# Patient Record
Sex: Female | Born: 1980 | Race: White | Hispanic: No | Marital: Single | Smoking: Never smoker
Health system: Southern US, Community
[De-identification: ages and names within clinical notes are randomized; demographics above are authoritative.]

## PROBLEM LIST (undated history)

## (undated) DIAGNOSIS — B373 Candidiasis of vulva and vagina: Secondary | ICD-10-CM

## (undated) DIAGNOSIS — Z833 Family history of diabetes mellitus: Secondary | ICD-10-CM

## (undated) DIAGNOSIS — Z8249 Family history of ischemic heart disease and other diseases of the circulatory system: Secondary | ICD-10-CM

## (undated) DIAGNOSIS — N870 Mild cervical dysplasia: Secondary | ICD-10-CM

## (undated) DIAGNOSIS — E611 Iron deficiency: Secondary | ICD-10-CM

## (undated) DIAGNOSIS — Z8742 Personal history of other diseases of the female genital tract: Secondary | ICD-10-CM

## (undated) DIAGNOSIS — B3731 Acute candidiasis of vulva and vagina: Secondary | ICD-10-CM

## (undated) DIAGNOSIS — B379 Candidiasis, unspecified: Secondary | ICD-10-CM

## (undated) DIAGNOSIS — Z8619 Personal history of other infectious and parasitic diseases: Secondary | ICD-10-CM

## (undated) DIAGNOSIS — R87613 High grade squamous intraepithelial lesion on cytologic smear of cervix (HGSIL): Secondary | ICD-10-CM

## (undated) HISTORY — PX: WISDOM TOOTH EXTRACTION: SHX21

## (undated) HISTORY — DX: Iron deficiency: E61.1

## (undated) HISTORY — DX: Personal history of other diseases of the female genital tract: Z87.42

## (undated) HISTORY — DX: Personal history of other infectious and parasitic diseases: Z86.19

## (undated) HISTORY — DX: Acute candidiasis of vulva and vagina: B37.31

## (undated) HISTORY — DX: High grade squamous intraepithelial lesion on cytologic smear of cervix (HGSIL): R87.613

## (undated) HISTORY — DX: Family history of diabetes mellitus: Z83.3

## (undated) HISTORY — DX: Candidiasis of vulva and vagina: B37.3

## (undated) HISTORY — DX: Candidiasis, unspecified: B37.9

## (undated) HISTORY — DX: Family history of ischemic heart disease and other diseases of the circulatory system: Z82.49

## (undated) HISTORY — DX: Mild cervical dysplasia: N87.0

---
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
## 2005-08-29 DIAGNOSIS — Z8742 Personal history of other diseases of the female genital tract: Secondary | ICD-10-CM

## 2005-08-29 HISTORY — DX: Personal history of other diseases of the female genital tract: Z87.42

## 2005-09-27 ENCOUNTER — Other Ambulatory Visit: Admission: RE | Admit: 2005-09-27 | Discharge: 2005-09-27 | Payer: Self-pay | Admitting: Obstetrics and Gynecology

## 2005-12-04 DIAGNOSIS — N870 Mild cervical dysplasia: Secondary | ICD-10-CM

## 2005-12-04 DIAGNOSIS — R87613 High grade squamous intraepithelial lesion on cytologic smear of cervix (HGSIL): Secondary | ICD-10-CM

## 2005-12-04 HISTORY — DX: High grade squamous intraepithelial lesion on cytologic smear of cervix (HGSIL): R87.613

## 2005-12-04 HISTORY — DX: Mild cervical dysplasia: N87.0

## 2006-05-21 ENCOUNTER — Other Ambulatory Visit: Admission: RE | Admit: 2006-05-21 | Discharge: 2006-05-21 | Payer: Self-pay | Admitting: Obstetrics and Gynecology

## 2008-08-21 ENCOUNTER — Emergency Department (HOSPITAL_COMMUNITY): Admission: EM | Admit: 2008-08-21 | Discharge: 2008-08-21 | Payer: Self-pay | Admitting: Family Medicine

## 2011-07-31 LAB — POCT URINALYSIS DIP (DEVICE)
Glucose, UA: NEGATIVE
Ketones, ur: NEGATIVE
Operator id: 270961
Specific Gravity, Urine: 1.015
Urobilinogen, UA: 0.2

## 2011-07-31 LAB — URINE CULTURE

## 2011-07-31 LAB — POCT PREGNANCY, URINE: Preg Test, Ur: NEGATIVE

## 2012-03-03 ENCOUNTER — Other Ambulatory Visit (INDEPENDENT_AMBULATORY_CARE_PROVIDER_SITE_OTHER): Payer: Medicaid Other

## 2012-03-03 DIAGNOSIS — Z3009 Encounter for other general counseling and advice on contraception: Secondary | ICD-10-CM

## 2012-03-03 MED ORDER — MEDROXYPROGESTERONE ACETATE 150 MG/ML IM SUSP
150.0000 mg | Freq: Once | INTRAMUSCULAR | Status: AC
  Administered 2012-03-03: 150 mg via INTRAMUSCULAR

## 2012-03-03 NOTE — Progress Notes (Unsigned)
Next Depo due 05-25-2012 

## 2012-03-28 ENCOUNTER — Ambulatory Visit (INDEPENDENT_AMBULATORY_CARE_PROVIDER_SITE_OTHER): Payer: Medicaid Other | Admitting: Obstetrics and Gynecology

## 2012-03-28 ENCOUNTER — Encounter: Payer: Self-pay | Admitting: Obstetrics and Gynecology

## 2012-03-28 VITALS — BP 122/68 | Ht 65.0 in | Wt 143.0 lb

## 2012-03-28 DIAGNOSIS — R109 Unspecified abdominal pain: Secondary | ICD-10-CM

## 2012-03-28 DIAGNOSIS — N951 Menopausal and female climacteric states: Secondary | ICD-10-CM

## 2012-03-28 DIAGNOSIS — Z0000 Encounter for general adult medical examination without abnormal findings: Secondary | ICD-10-CM

## 2012-03-28 DIAGNOSIS — R232 Flushing: Secondary | ICD-10-CM

## 2012-03-28 DIAGNOSIS — Z124 Encounter for screening for malignant neoplasm of cervix: Secondary | ICD-10-CM

## 2012-03-28 LAB — TSH: TSH: 1.107 u[IU]/mL (ref 0.350–4.500)

## 2012-03-28 MED ORDER — MEDROXYPROGESTERONE ACETATE 150 MG/ML IM SUSP: 150.0000 mg | INTRAMUSCULAR | Status: DC

## 2012-03-28 NOTE — Progress Notes (Signed)
Last Pap: 01/19/2011 WNL: Yes Regular Periods:no Contraception: Depo Provera  Monthly Breast exam:yes Tetanus<79yrs:no Nl.Bladder Function:yes Daily BMs:yes Healthy Diet:yes Calcium:yes Mammogram:no Exercise:yes Seatbelt: yes Abuse at home: no Stressful work:no Sigmoid-colonoscopy: N/A  Bone Density: No Pt with left sided pain for one day.  Feels better to lay on it.  No trauma to her side.  No back pain or UTI sxs.   Pt also with hot flashes for six months.  No abnormal bleeding Physical Examination: General appearance - alert, well appearing, and in no distress Mental status - normal mood, behavior, speech, dress, motor activity, and thought processes Neck - supple, no significant adenopathy, thyroid exam: thyroid is normal in size without nodules or tenderness Chest - clear to auscultation, no wheezes, rales or rhonchi, symmetric air entry.  No rib tenderness or bruising on her left side Heart - normal rate and regular rhythm Abdomen - soft, nontender, nondistended, no masses or organomegaly Breasts - breasts appear normal, no suspicious masses, no skin or nipple changes or axillary nodes Pelvic - normal external genitalia, vulva, vagina, cervix, uterus and adnexa Rectal - normal rectal, no masses, rectal exam not indicated Back exam - full range of motion, no tenderness, palpable spasm or pain on motion Neurological - alert, oriented, normal speech, no focal findings or movement disorder noted Musculoskeletal - no joint tenderness, deformity or swelling Extremities - no edema, redness or tenderness in the calves or thighs Skin - normal coloration and turgor, no rashes, no suspicious skin lesions noted Routine exam Pap sent yes h/o abnormal pap Mammogram due no pt desires to stay on depo provera.  black box warnings reviewed wiht the pt.  check TSH for hot flashes Tylenol and wrm compresses for side pain RT 1 yr

## 2012-04-02 LAB — PAP IG, CT-NG, RFX HPV ASCU
Chlamydia Probe Amp: NEGATIVE
GC Probe Amp: NEGATIVE

## 2012-04-03 ENCOUNTER — Telehealth: Payer: Self-pay

## 2012-04-03 MED ORDER — METRONIDAZOLE 500 MG PO TABS: 500.0000 mg | ORAL_TABLET | Freq: Two times a day (BID) | ORAL | Status: DC

## 2012-04-03 NOTE — Telephone Encounter (Signed)
Spoke with pt rgd labs informed tsh wnl pap wnl but showed bv need abx pt wants rx sent to [facility] [address] and holden advised pt rx sent to pharm pt voice understanding

## 2012-04-03 NOTE — Telephone Encounter (Signed)
Lm on vm tcb rgd labs 

## 2012-04-03 NOTE — Telephone Encounter (Signed)
Message copied by Rolla Plate on Thu Apr 03, 2012 11:10 AM ------      Message from: Jaymes Graff      Created: Wed Apr 02, 2012 11:59 PM       Please tell pt BV was found on her pap smear.  She can be treated with either Metrogel one applicator in vagina QHS for five nights or Flagyl 500mg  one tablet twice a day for seven days.

## 2012-04-06 ENCOUNTER — Emergency Department (HOSPITAL_COMMUNITY): Payer: Medicaid Other

## 2012-04-06 ENCOUNTER — Encounter (HOSPITAL_COMMUNITY): Payer: Self-pay | Admitting: *Deleted

## 2012-04-06 ENCOUNTER — Emergency Department (HOSPITAL_COMMUNITY)
Admission: EM | Admit: 2012-04-06 | Discharge: 2012-04-07 | Disposition: A | Discharge status: Home / Self Care | Payer: Medicaid Other | Attending: Emergency Medicine | Admitting: Emergency Medicine

## 2012-04-06 DIAGNOSIS — R079 Chest pain, unspecified: Secondary | ICD-10-CM | POA: Insufficient documentation

## 2012-04-06 DIAGNOSIS — R10812 Left upper quadrant abdominal tenderness: Secondary | ICD-10-CM | POA: Insufficient documentation

## 2012-04-06 DIAGNOSIS — R059 Cough, unspecified: Secondary | ICD-10-CM | POA: Insufficient documentation

## 2012-04-06 DIAGNOSIS — R071 Chest pain on breathing: Secondary | ICD-10-CM | POA: Insufficient documentation

## 2012-04-06 DIAGNOSIS — R0789 Other chest pain: Secondary | ICD-10-CM

## 2012-04-06 DIAGNOSIS — R05 Cough: Secondary | ICD-10-CM | POA: Insufficient documentation

## 2012-04-06 LAB — CBC
HCT: 41.8 % (ref 36.0–46.0)
Hemoglobin: 14.1 g/dL (ref 12.0–15.0)
MCH: 30.9 pg (ref 26.0–34.0)
MCV: 91.5 fL (ref 78.0–100.0)
Platelets: 206 10*3/uL (ref 150–400)
RBC: 4.57 MIL/uL (ref 3.87–5.11)
WBC: 6.4 10*3/uL (ref 4.0–10.5)

## 2012-04-06 LAB — DIFFERENTIAL
Eosinophils Absolute: 0.1 10*3/uL (ref 0.0–0.7)
Eosinophils Relative: 2 % (ref 0–5)
Lymphocytes Relative: 27 % (ref 12–46)
Lymphs Abs: 1.7 10*3/uL (ref 0.7–4.0)
Monocytes Absolute: 0.7 10*3/uL (ref 0.1–1.0)
Monocytes Relative: 12 % (ref 3–12)

## 2012-04-06 LAB — POCT I-STAT, CHEM 8
BUN: 6 mg/dL (ref 6–23)
Calcium, Ion: 1.21 mmol/L (ref 1.12–1.32)
Creatinine, Ser: 0.8 mg/dL (ref 0.50–1.10)
Glucose, Bld: 90 mg/dL (ref 70–99)
Hemoglobin: 15 g/dL (ref 12.0–15.0)
Sodium: 142 mEq/L (ref 135–145)
TCO2: 24 mmol/L (ref 0–100)

## 2012-04-06 LAB — URINALYSIS, ROUTINE W REFLEX MICROSCOPIC
Bilirubin Urine: NEGATIVE
Hgb urine dipstick: NEGATIVE
Nitrite: NEGATIVE
Protein, ur: NEGATIVE mg/dL
Specific Gravity, Urine: 1.006 (ref 1.005–1.030)
Urobilinogen, UA: 0.2 mg/dL (ref 0.0–1.0)

## 2012-04-06 LAB — URINE MICROSCOPIC-ADD ON

## 2012-04-06 MED ORDER — NAPROXEN 500 MG PO TABS: 500.0000 mg | ORAL_TABLET | Freq: Two times a day (BID) | ORAL | Status: AC

## 2012-04-06 MED ORDER — OXYCODONE-ACETAMINOPHEN 5-325 MG PO TABS
1.0000 | ORAL_TABLET | Freq: Once | ORAL | Status: AC
  Administered 2012-04-06: 1 via ORAL
  Filled 2012-04-06: qty 1

## 2012-04-06 NOTE — Discharge Instructions (Signed)
Chest Wall Pain Chest wall pain is pain in or around the bones and muscles of your chest. It may take up to 6 weeks to get better. It may take longer if you must stay physically active in your work and activities.  CAUSES  Chest wall pain may happen on its own. However, it may be caused by:  A viral illness like the flu.   Injury.   Coughing.   Exercise.   Arthritis.   Fibromyalgia.   Shingles.  HOME CARE INSTRUCTIONS   Avoid overtiring physical activity. Try not to strain or perform activities that cause pain. This includes any activities using your chest or your abdominal and side muscles, especially if heavy weights are used.   Put ice on the sore area.   Put ice in a plastic bag.   Place a towel between your skin and the bag.   Leave the ice on for 15 to 20 minutes per hour while awake for the first 2 days.   Only take over-the-counter or prescription medicines for pain, discomfort, or fever as directed by your caregiver.  SEEK IMMEDIATE MEDICAL CARE IF:   Your pain increases, or you are very uncomfortable.   You have a fever.   Your chest pain becomes worse.   You have new, unexplained symptoms.   You have nausea or vomiting.   You feel sweaty or lightheaded.   You have a cough with phlegm (sputum), or you cough up blood.  MAKE SURE YOU:   Understand these instructions.   Will watch your condition.   Will get help right away if you are not doing well or get worse.  Document Released: 10/15/2005 Document Revised: 10/04/2011 Document Reviewed: 06/11/2011 [employer] Patient Information 2012 [employer]. 

## 2012-04-06 NOTE — ED Provider Notes (Signed)
History     CSN: [id number]  Arrival date & time 04/06/12  2005   First MD Initiated Contact with Patient 04/06/12 2144      Chief Complaint  Patient presents with  . left side pain     (Consider location/radiation/quality/duration/timing/severity/associated sxs/prior treatment) HPI  31 year old female presents complaining of left-sided chest/flank pain. Patient states for the past 2 weeks she has had constant pain to her left lower chest. Pain is described as a sharp sensation, constant, worsened when she lays on the left side. Nothing seems to improve it. She has tried taking Tylenol ibuprofen Aleve Motrin without adequate relief. She has had occasional nonproductive cough but otherwise no other symptoms. She denies fever, headache, shortness of breath, pleuritic chest pain, dyspnea on exertion, abdominal pain, urinary symptoms, or rash. She has had chickenpox in the past but no history of shingle. She does admits to being stressed out most of the usual. She denies any recent strenuous exercise, or recent trauma. She is currently being treated for bacterial vaginosis with Flagyl. Patient received Depo-Provera shot. She denies any recent surgery, prolonged bed rest, leg swelling, or calf tenderness.  Past Medical History  Diagnosis Date  . H/O varicella   . FH: heart disease   . FH: hypertension   . FH: diabetes mellitus   . Low iron   . Yeast infection   . H/O amenorrhea 08/2005  . 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 Yeast vaginitis     History reviewed. No pertinent past surgical history.  Family History  Problem Relation Age of Onset  . Diabetes Mother   . Hyperlipidemia Mother   . Hypertension Mother   . Hyperlipidemia Father   . Hypertension Father     History  Substance Use Topics  . Smoking status: Current Everyday Smoker    Types: Cigarettes  . Smokeless  tobacco: Never Used  . Alcohol Use: No    OB History    Grav Para Term Preterm Abortions TAB SAB Ect Mult Living   2 1        1       Review of Systems  All other systems reviewed and are negative.    Allergies  Review of patient's allergies indicates no known allergies.  Home Medications   Current Outpatient Rx  Name Route Sig Dispense Refill  . CALCIUM CARBONATE 200 MG PO CAPS Oral Take 250 mg by mouth 2 (two) times daily with a meal.    . MEDROXYPROGESTERONE ACETATE 150 MG/ML IM SUSP Intramuscular Inject 150 mg into the muscle every 3 (three) months. Last had April 2013    . METRONIDAZOLE 500 MG PO TABS Oral Take 500 mg by mouth 2 (two) times daily. For 7 days. Started 04/03/12    . ONE-DAILY MULTI VITAMINS PO TABS Oral Take 1 tablet by mouth daily.      BP 139/86  Pulse 120  Temp(Src) 99.4 F (37.4 C) (Oral)  Resp 20  SpO2 99%  Physical Exam  Nursing note and vitals reviewed. Constitutional: She is oriented to person, place, and time. She appears well-developed and well-nourished. No distress.       Awake, alert, nontoxic appearance  HENT:  Head: Atraumatic.  Eyes: Conjunctivae are normal. Right eye exhibits no discharge. Left eye exhibits no discharge.  Neck: Neck supple.  Cardiovascular: Normal rate and regular rhythm.   Pulmonary/Chest: Effort normal. No respiratory  distress. She exhibits tenderness.    Abdominal: Soft. There is no tenderness. There is no rebound.  Musculoskeletal: She exhibits no edema and no tenderness.       ROM appears intact, no obvious focal weakness  Neurological: She is alert and oriented to person, place, and time.       Mental status and motor strength appears intact  Skin: No rash noted.  Psychiatric: She has a normal mood and affect.    ED Course  Procedures (including critical care time)  Labs Reviewed  URINALYSIS, ROUTINE W REFLEX MICROSCOPIC - Abnormal; Notable for the following:    Leukocytes, UA TRACE (*)    All other  components within normal limits  URINE MICROSCOPIC-ADD ON - Abnormal; Notable for the following:    Squamous Epithelial / LPF FEW (*)    All other components within normal limits  POCT PREGNANCY, URINE   No results found.   No diagnosis found.  11:01 PM  Date: 04/06/2012  Rate: 98  Rhythm: normal sinus rhythm  QRS Axis: normal  Intervals: normal  ST/T Wave abnormalities: normal  Conduction Disutrbances:none  Narrative Interpretation: Normal EKG  Old EKG Reviewed: none available  Results for orders placed during the hospital encounter of 04/06/12  URINALYSIS, ROUTINE W REFLEX MICROSCOPIC      Component Value Range   Color, Urine YELLOW  YELLOW    APPearance CLEAR  CLEAR    Specific Gravity, Urine 1.006  1.005 - 1.030    pH 7.0  5.0 - 8.0    Glucose, UA NEGATIVE  NEGATIVE (mg/dL)   Hgb urine dipstick NEGATIVE  NEGATIVE    Bilirubin Urine NEGATIVE  NEGATIVE    Ketones, ur NEGATIVE  NEGATIVE (mg/dL)   Protein, ur NEGATIVE  NEGATIVE (mg/dL)   Urobilinogen, UA 0.2  0.0 - 1.0 (mg/dL)   Nitrite NEGATIVE  NEGATIVE    Leukocytes, UA TRACE (*) NEGATIVE   POCT PREGNANCY, URINE      Component Value Range   Preg Test, Ur NEGATIVE  NEGATIVE   URINE MICROSCOPIC-ADD ON      Component Value Range   Squamous Epithelial / LPF FEW (*) RARE    WBC, UA 0-2  <3 (WBC/hpf)  D-DIMER, QUANTITATIVE      Component Value Range   D-Dimer, Quant 0.35  0.00 - 0.48 (ug/mL-FEU)  CBC      Component Value Range   WBC 6.4  4.0 - 10.5 (K/uL)   RBC 4.57  3.87 - 5.11 (MIL/uL)   Hemoglobin 14.1  12.0 - 15.0 (g/dL)   HCT 45.4  09.8 - 11.9 (%)   MCV 91.5  78.0 - 100.0 (fL)   MCH 30.9  26.0 - 34.0 (pg)   MCHC 33.7  30.0 - 36.0 (g/dL)   RDW 14.7  82.9 - 56.2 (%)   Platelets 206  150 - 400 (K/uL)  DIFFERENTIAL      Component Value Range   Neutrophils Relative 60  43 - 77 (%)   Neutro Abs 3.8  1.7 - 7.7 (K/uL)   Lymphocytes Relative 27  12 - 46 (%)   Lymphs Abs 1.7  0.7 - 4.0 (K/uL)   Monocytes  Relative 12  3 - 12 (%)   Monocytes Absolute 0.7  0.1 - 1.0 (K/uL)   Eosinophils Relative 2  0 - 5 (%)   Eosinophils Absolute 0.1  0.0 - 0.7 (K/uL)   Basophils Relative 0  0 - 1 (%)   Basophils Absolute 0.0  0.0 -  0.1 (K/uL)  POCT I-STAT, CHEM 8      Component Value Range   Sodium 142  135 - 145 (mEq/L)   Potassium 3.5  3.5 - 5.1 (mEq/L)   Chloride 105  96 - 112 (mEq/L)   BUN 6  6 - 23 (mg/dL)   Creatinine, Ser 1.61  0.50 - 1.10 (mg/dL)   Glucose, Bld 90  70 - 99 (mg/dL)   Calcium, Ion 0.96  0.45 - 1.32 (mmol/L)   TCO2 24  0 - 100 (mmol/L)   Hemoglobin 15.0  12.0 - 15.0 (g/dL)   HCT 40.9  81.1 - 91.4 (%)   Dg Chest 2 View  04/06/2012  *RADIOLOGY REPORT*  Clinical Data: Chest and left upper quadrant abdominal pain for 2 weeks.  Cough.  CHEST - 2 VIEW  Comparison: None.  Findings: A mild pectus excavatum deformity. Midline trachea. Normal heart size and mediastinal contours. No pleural effusion or pneumothorax.  Clear lungs.  Minimal convex right thoracolumbar spine curvature.  IMPRESSION: No acute cardiopulmonary disease.  Original Report Authenticated By: Consuello Bossier, M.D.      MDM  Patient with reproducible left flank pain , however no CVA tenderness. No rash noted, no overlying skin changes. Her lungs clear to all station bilaterally. Her vital signs significant for a heart rate of 120 with increased respiratory rate of 20. However when I rechecked the respiratory rate is 14. Although this may be secondary to anxiety, I cannot rule out PE. A d-dimer ordered. Workup initiated. Pain medication given.   11:47 PM Work up today shows no acute finding concerning for life threatening emergency.  Reassurance given.  Will prescribe a short course of pain medication as her OTC medication failed to provide relief.  Pt to f/u with PCP, resource given.  Strict return precaution discussed.  Smoking cessation discussed.       Fayrene Helper, PA-C 04/06/12 2350

## 2012-04-06 NOTE — ED Notes (Signed)
Patient with left side pain under her rib cage area and now radiating to her lower back.  Pain became worse last night.  Patient denies any urinary symptoms

## 2012-04-06 NOTE — ED Notes (Signed)
C/o constant pain on left side under ribcage that radiates to the back.

## 2012-04-06 NOTE — ED Provider Notes (Signed)
11:01 PM  Date: 04/06/2012  Rate: 98  Rhythm: normal sinus rhythm  QRS Axis: normal  Intervals: normal  ST/T Wave abnormalities: normal  Conduction Disutrbances:none  Narrative Interpretation: Normal EKG  Old EKG Reviewed: none available    Carleene Cooper III, MD 04/06/12 [phone]

## 2012-04-07 NOTE — ED Notes (Signed)
Pt states understanding of discharge instructions 

## 2012-04-08 NOTE — ED Provider Notes (Signed)
Medical screening examination/treatment/procedure(s) were performed by non-physician practitioner and as supervising physician I was immediately available for consultation/collaboration.   Carleene Cooper III, MD 04/08/12 [phone]

## 2012-05-19 ENCOUNTER — Telehealth: Payer: Self-pay | Admitting: Obstetrics and Gynecology

## 2012-05-19 ENCOUNTER — Encounter: Payer: Medicaid Other | Admitting: Obstetrics and Gynecology

## 2012-05-19 NOTE — Telephone Encounter (Signed)
Triage/epic 

## 2012-05-19 NOTE — Telephone Encounter (Signed)
Spoke with pt rgd msg pt c/o raw spot in vaginal area painful with urination wants eval pt has appt 05/19/12 at 3:00 with ep pt voice understanding

## 2012-05-20 ENCOUNTER — Ambulatory Visit (INDEPENDENT_AMBULATORY_CARE_PROVIDER_SITE_OTHER): Payer: Medicaid Other | Admitting: Obstetrics and Gynecology

## 2012-05-20 ENCOUNTER — Encounter: Payer: Self-pay | Admitting: Obstetrics and Gynecology

## 2012-05-20 VITALS — BP 102/70 | Temp 98.9°F | Wt 137.0 lb

## 2012-05-20 DIAGNOSIS — N9089 Other specified noninflammatory disorders of vulva and perineum: Secondary | ICD-10-CM

## 2012-05-20 DIAGNOSIS — Z113 Encounter for screening for infections with a predominantly sexual mode of transmission: Secondary | ICD-10-CM

## 2012-05-20 NOTE — Progress Notes (Signed)
31 YO complains of a raw spot on left  vaginal area since yesterday. Admits to pain when urine touches the area.  Wants STD testing.  Denies vaginitis symptoms.     O: Pelvic:  EGBUS-left medial labia minora with tender, abraded appearing area without vesicular features, no inguinal adenopathy, vagina-normal rugae,      cervix-no lesions  A: Medial Left Labial Lesion  P:  STD testing to include HSV culture-pending                   RTO-as scheduled

## 2012-05-21 LAB — GC/CHLAMYDIA PROBE AMP, GENITAL: GC Probe Amp, Genital: NEGATIVE

## 2012-05-22 LAB — HERPES SIMPLEX VIRUS CULTURE: Organism ID, Bacteria: NOT DETECTED

## 2012-05-23 ENCOUNTER — Other Ambulatory Visit (INDEPENDENT_AMBULATORY_CARE_PROVIDER_SITE_OTHER): Payer: Medicaid Other

## 2012-05-23 DIAGNOSIS — Z3009 Encounter for other general counseling and advice on contraception: Secondary | ICD-10-CM

## 2012-05-23 MED ORDER — MEDROXYPROGESTERONE ACETATE 150 MG/ML IM SUSP
150.0000 mg | Freq: Once | INTRAMUSCULAR | Status: AC
  Administered 2012-05-23: 150 mg via INTRAMUSCULAR

## 2012-05-23 NOTE — Progress Notes (Unsigned)
Next Depo due 08-14-2012

## 2012-08-14 ENCOUNTER — Other Ambulatory Visit: Payer: Self-pay

## 2014-05-08 IMAGING — CR DG CHEST 2V
2 series · 2 of 2 positions shown · non-contrast
Comparison: None.

CLINICAL DATA: Chest and left upper quadrant abdominal pain for 2
weeks.  Cough.

CHEST - 2 VIEW

[w chest pa]
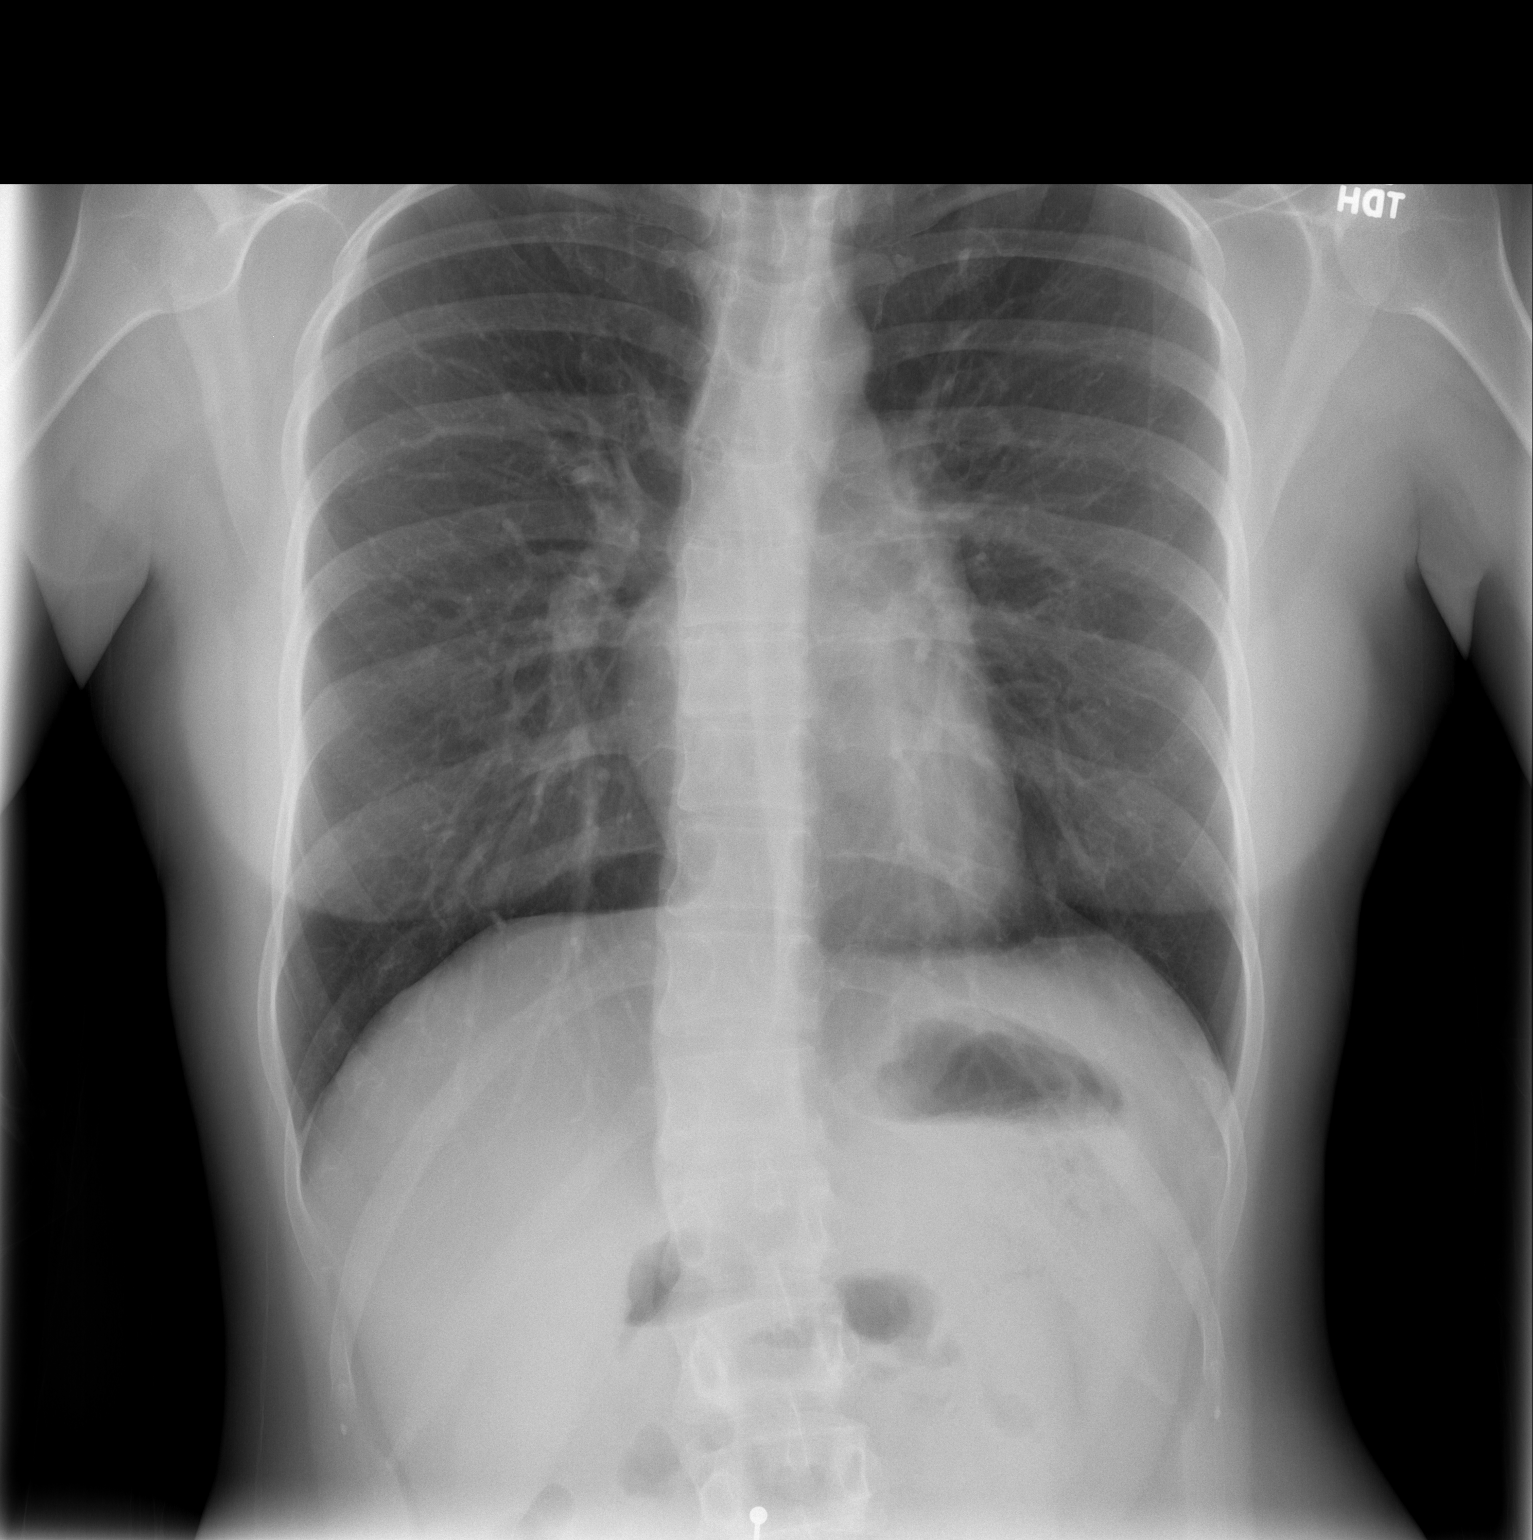

[w chest lat]
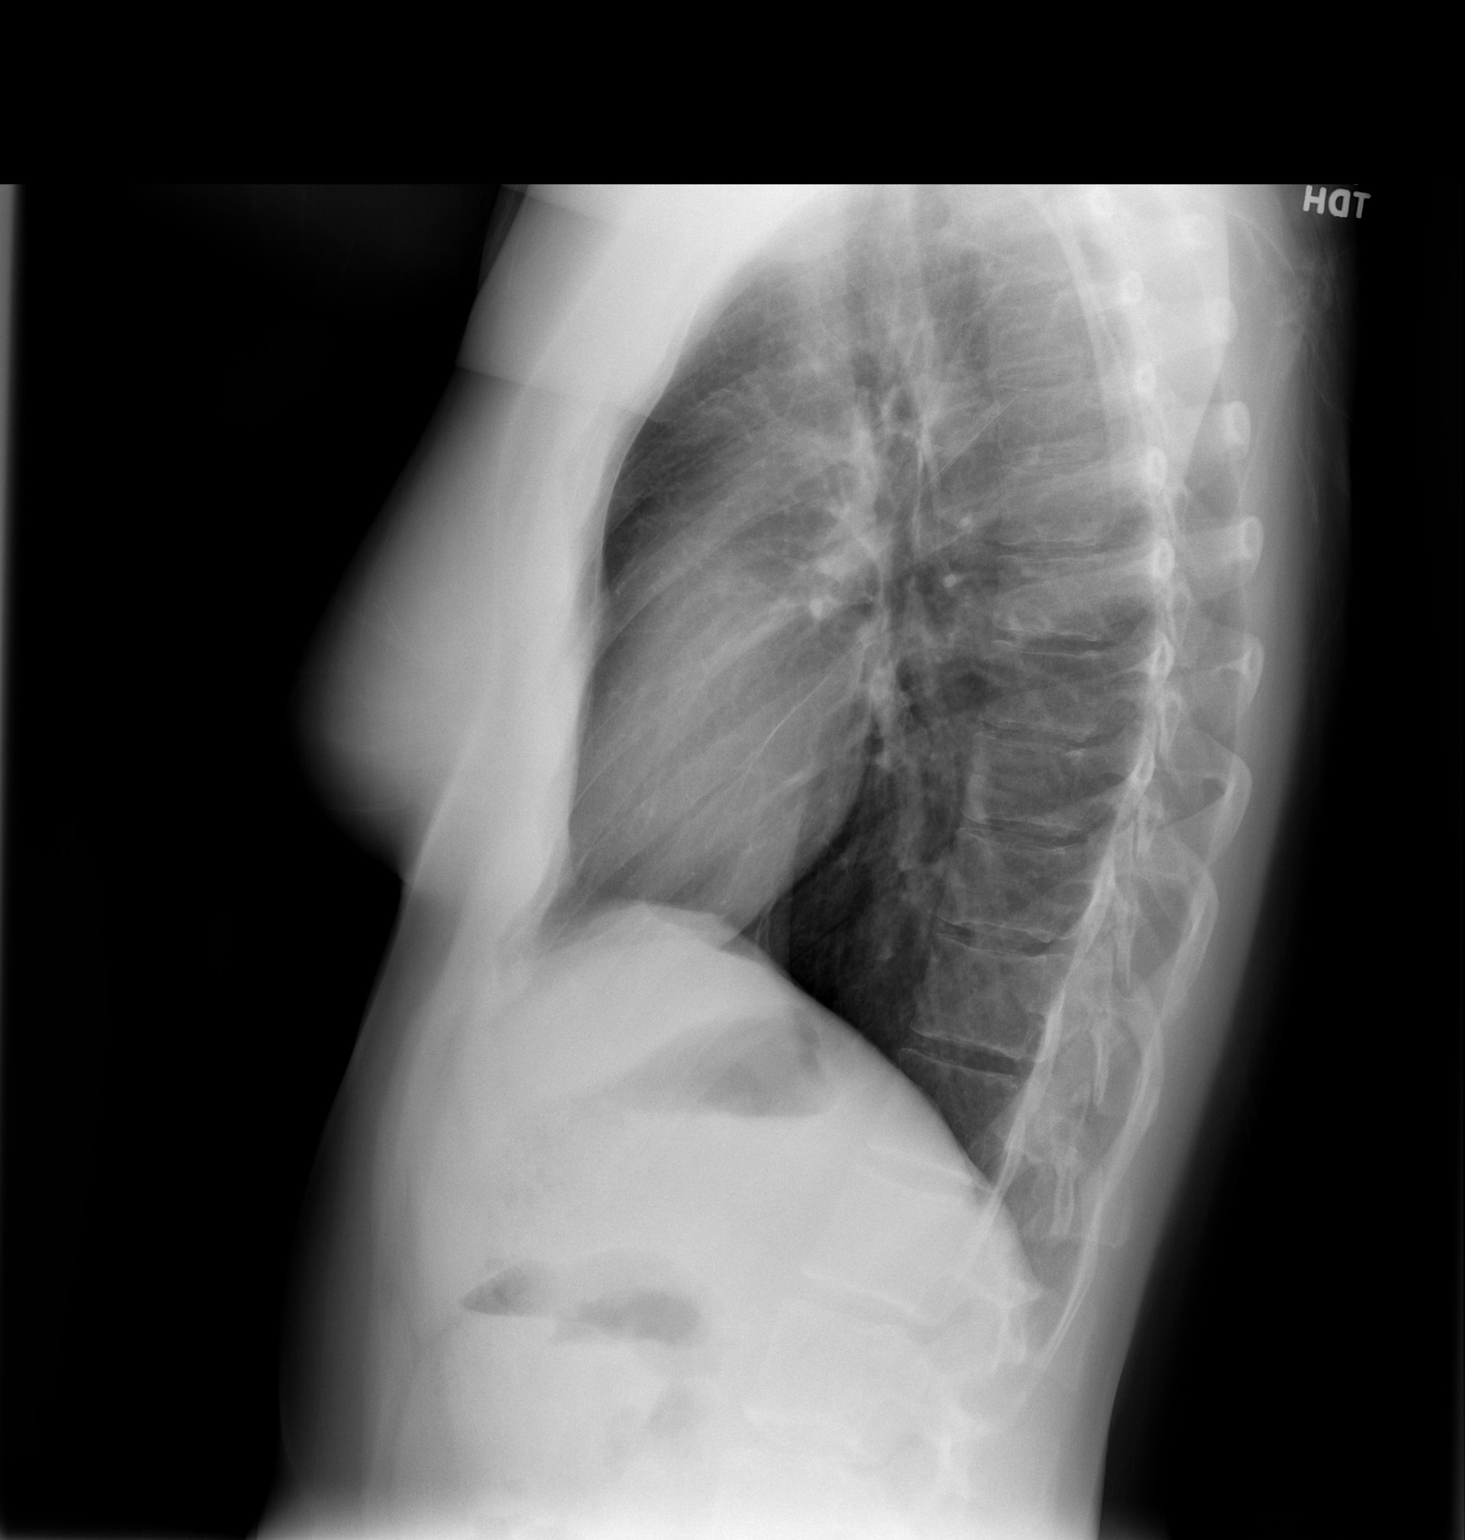

[2 of 2 positions shown; findings below may reference images not displayed]

FINDINGS: A mild pectus excavatum deformity. Midline trachea.
Normal heart size and mediastinal contours. No pleural effusion or
pneumothorax.  Clear lungs.

Minimal convex right thoracolumbar spine curvature.
IMPRESSION: No acute cardiopulmonary disease.

## 2014-08-30 ENCOUNTER — Encounter: Payer: Self-pay | Admitting: Obstetrics and Gynecology

## 2016-11-01 ENCOUNTER — Emergency Department (HOSPITAL_BASED_OUTPATIENT_CLINIC_OR_DEPARTMENT_OTHER)
Admission: EM | Admit: 2016-11-01 | Discharge: 2016-11-01 | Disposition: A | Discharge status: Home / Self Care | Payer: Medicaid Other | Attending: Emergency Medicine | Admitting: Emergency Medicine

## 2016-11-01 ENCOUNTER — Encounter (HOSPITAL_BASED_OUTPATIENT_CLINIC_OR_DEPARTMENT_OTHER): Payer: Self-pay | Admitting: Emergency Medicine

## 2016-11-01 DIAGNOSIS — Z79899 Other long term (current) drug therapy: Secondary | ICD-10-CM | POA: Diagnosis not present

## 2016-11-01 DIAGNOSIS — L03032 Cellulitis of left toe: Secondary | ICD-10-CM | POA: Diagnosis not present

## 2016-11-01 DIAGNOSIS — L03031 Cellulitis of right toe: Secondary | ICD-10-CM

## 2016-11-01 DIAGNOSIS — M79675 Pain in left toe(s): Secondary | ICD-10-CM | POA: Diagnosis present

## 2016-11-01 NOTE — Discharge Instructions (Signed)
Please read attached information. If you experience any new or worsening signs or symptoms please return to the emergency room for evaluation. Please follow-up with your primary care provider or specialist as discussed. Please continue using warm water soaks and Neosporin.

## 2016-11-01 NOTE — ED Triage Notes (Signed)
Pt reports L great toe pain following a pedicure earlier this week. Pt reports pus and redness as well.

## 2016-11-01 NOTE — ED Provider Notes (Signed)
[facility]   CSN: [id number] Arrival date & time: 11/01/16  1609  By signing my name below, I, Emily Stevenson, attest that this documentation has been prepared under the direction and in the presence of Burna FortsJeff Lister Brizzi, PA-C Electronically Signed: Soijett Stevenson, ED Scribe. 11/01/16. 5:16 PM.  History   Chief Complaint Chief Complaint  Patient presents with  . Toe Pain    HPI Emily Stevenson is a 36 y.o. female who presents to the Emergency Department complaining of left great toe pain onset 4 days ago. Pt notes that she got a pedicure prior to the onset of her symptoms. Pt is having associated symptoms of mild redness to left great toe. She has tried massaging the area, warm epsom salt soaks, neosporin, and peroxide with mild relief of her symptoms. She denies fever, chills, swelling, and any other symptoms.    The history is provided by the patient. No language interpreter was used.    Past Medical History:  Diagnosis Date  . 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 FH: diabetes mellitus   . FH: heart disease   . FH: hypertension   . H/O amenorrhea 08/2005  . H/O varicella   . 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 Low iron   . Yeast infection   . Yeast vaginitis     There are no active problems to display for this patient.   Past Surgical History:  Procedure Laterality Date  . WISDOM TOOTH EXTRACTION      OB History    Gravida Para Term Preterm AB Living   2 1       1    SAB TAB Ectopic Multiple Live Births           1       Home Medications    Prior to Admission medications   Medication Sig Start Date End Date Taking? Authorizing Provider  calcium carbonate 200 MG capsule Take 250 mg by mouth 2 (two) times daily with a meal.   Yes Historical Provider, MD  medroxyPROGESTERone (DEPO-PROVERA) 150 MG/ML injection Inject 150 mg into the muscle every 3 (three) months. Last had April  2013   Yes Historical Provider, MD  Multiple Vitamin (MULTIVITAMIN) tablet Take 1 tablet by mouth daily.   Yes Historical Provider, MD  metroNIDAZOLE (FLAGYL) 500 MG tablet Take 500 mg by mouth 2 (two) times daily. For 7 days. Started 04/03/12    Historical Provider, MD    Family History Family History  Problem Relation Age of Onset  . Diabetes Mother   . Hyperlipidemia Mother   . Hypertension Mother   . Hyperlipidemia Father   . Hypertension Father     Social History Social History  Substance Use Topics  . Smoking status: Never Smoker  . Smokeless tobacco: Never Used  . Alcohol use Yes     Allergies   Patient has no known allergies.   Review of Systems Review of Systems  Constitutional: Negative for chills and fever.  Musculoskeletal: Positive for arthralgias (left great toe). Negative for joint swelling.  Skin: Positive for color change (mild redness to left great toe).     Physical Exam Updated Vital Signs BP 129/87 (BP Location: Right Arm)   Pulse 99   Temp 99 F (37.2 C) (Oral)   Resp 16   Wt 63.5 kg   SpO2 100%   BMI 23.30 kg/m   Physical Exam  Constitutional:  She is oriented to person, place, and time. She appears well-developed and well-nourished. No distress.  HENT:  Head: Normocephalic and atraumatic.  Eyes: EOM are normal.  Neck: Neck supple.  Cardiovascular: Normal rate.   Pulmonary/Chest: Effort normal. No respiratory distress.  Abdominal: She exhibits no distension.  Musculoskeletal: Normal range of motion.  Left great toe medial aspect with small erythema, no fluctuance, or discharge. Remainder of toe normal.   Neurological: She is alert and oriented to person, place, and time.  Skin: Skin is warm and dry. There is erythema.  Psychiatric: She has a normal mood and affect. Her behavior is normal.  Nursing note and vitals reviewed.    ED Treatments / Results  DIAGNOSTIC STUDIES: Oxygen Saturation is 100% on RA, nl by my interpretation.      COORDINATION OF CARE: 5:11 PM Discussed treatment plan with pt at bedside and pt agreed to plan.  Procedures Procedures (including critical care time)  Medications Ordered in ED Medications - No data to display   Initial Impression / Assessment and Plan / ED Course  I have reviewed the triage vital signs and the nursing notes.   Clinical Course      Labs:   Imaging:   Consults:   Therapeutics:   Discharge Meds:   Assessment/Plan: Pt presents with likely already erupted paronychia that is very minor. No signs of cellulitis. Pt encouraged to continue warm water soaks and return if symptoms worsened. Pt is instructed to follow up with her PCP if her symptoms persists.  Pt verbalized understanding and agreement to today's plan and had no further questions or concerns at this time.    Final Clinical Impressions(s) / ED Diagnoses   Final diagnoses:  Paronychia of great toe, right    New Prescriptions Discharge Medication List as of 11/01/2016  5:19 PM     I personally performed the services described in this documentation, which was scribed in my presence. The recorded information has been reviewed and is accurate.     Eyvonne Mechanic, PA-C 11/02/16 1610    Geoffery Lyons, MD 11/05/16 1155

## 2024-10-20 ENCOUNTER — Emergency Department (HOSPITAL_COMMUNITY)
Admission: EM | Admit: 2024-10-20 | Discharge: 2024-10-21 | Disposition: A | Discharge status: Home / Self Care | Attending: Emergency Medicine | Admitting: Emergency Medicine

## 2024-10-20 ENCOUNTER — Encounter (HOSPITAL_COMMUNITY): Payer: Self-pay | Admitting: Emergency Medicine

## 2024-10-20 ENCOUNTER — Other Ambulatory Visit: Payer: Self-pay

## 2024-10-20 DIAGNOSIS — R404 Transient alteration of awareness: Secondary | ICD-10-CM | POA: Diagnosis present

## 2024-10-20 DIAGNOSIS — T43295A Adverse effect of other antidepressants, initial encounter: Secondary | ICD-10-CM | POA: Diagnosis not present

## 2024-10-20 DIAGNOSIS — T887XXA Unspecified adverse effect of drug or medicament, initial encounter: Secondary | ICD-10-CM

## 2024-10-20 LAB — COMPREHENSIVE METABOLIC PANEL WITH GFR
ALT: 55 U/L — ABNORMAL HIGH (ref 0–44)
AST: 29 U/L (ref 15–41)
Albumin: 4.5 g/dL (ref 3.5–5.0)
Alkaline Phosphatase: 67 U/L (ref 38–126)
Anion gap: 13 (ref 5–15)
BUN: 11 mg/dL (ref 6–20)
CO2: 19 mmol/L — ABNORMAL LOW (ref 22–32)
Calcium: 9.8 mg/dL (ref 8.9–10.3)
Chloride: 109 mmol/L (ref 98–111)
Creatinine, Ser: 1.13 mg/dL — ABNORMAL HIGH (ref 0.44–1.00)
GFR, Estimated: >60 mL/min
Glucose, Bld: 75 mg/dL (ref 70–99)
Potassium: 3.4 mmol/L — ABNORMAL LOW (ref 3.5–5.1)
Sodium: 142 mmol/L (ref 135–145)
Total Bilirubin: 0.4 mg/dL (ref 0.0–1.2)
Total Protein: 7.9 g/dL (ref 6.5–8.1)

## 2024-10-20 LAB — CBC
HCT: 39.5 % (ref 36.0–46.0)
Hemoglobin: 13.1 g/dL (ref 12.0–15.0)
MCH: 30 pg (ref 26.0–34.0)
MCHC: 33.2 g/dL (ref 30.0–36.0)
MCV: 90.4 fL (ref 80.0–100.0)
Platelets: 227 K/uL (ref 150–400)
RBC: 4.37 MIL/uL (ref 3.87–5.11)
RDW: 13.3 % (ref 11.5–15.5)
WBC: 9.5 K/uL (ref 4.0–10.5)
nRBC: 0 % (ref 0.0–0.2)

## 2024-10-20 LAB — HCG, SERUM, QUALITATIVE: Preg, Serum: NEGATIVE

## 2024-10-20 LAB — ETHANOL: Alcohol, Ethyl (B): <15 mg/dL

## 2024-10-20 NOTE — ED Triage Notes (Signed)
 Pt arrives w/ family who reports she has been somewhat altered today. Was found walking around a parking lot wandering and lying in grass.  Started Venlafaxine & Desvenlafaxine last week and think she may be having a reaction to meds.  Denies SI/HI. Denies hallucinations.

## 2024-10-21 NOTE — ED Provider Notes (Signed)
 " [place] [facility] AT [facility] Provider Note   CSN: [id number] Arrival date & time: 10/20/24  2140     Patient presents with: Medical Clearance   Emily Stevenson is a 43 y.o. female.   The history is provided by the patient and medical records.   43 y.o. F with hx of HTN, depression, anxiety, presenting to the ED for medical clearance.  Reportedly, last week patient was started on new medications including venlafaxine and desvenlafaxine by her psychiatrist.  She reports she thinks she was taking the medication wrong.  She was supposed to take 1 tablet to start then increase to twice daily but she was taking 1 in the AM and 2 in the evening.  She states all day she felt like she was in a fog.  Family reports she seemed a bit paranoid today, felt like she was being recorded on cell phones, etc.  Since arrival in the ED she has returned to baseline per family.  Patient is very eager to go home.  She denies SI/HI/AVH.    Prior to Admission medications  Medication Sig Start Date End Date Taking? Authorizing Provider  calcium carbonate 200 MG capsule Take 250 mg by mouth 2 (two) times daily with a meal.    [provider]  medroxyPROGESTERone  (DEPO-PROVERA ) 150 MG/ML injection Inject 150 mg into the muscle every 3 (three) months. Last had April 2013    [provider]  metroNIDAZOLE  (FLAGYL ) 500 MG tablet Take 500 mg by mouth 2 (two) times daily. For 7 days. Started 04/03/12    [provider]  Multiple Vitamin (MULTIVITAMIN) tablet Take 1 tablet by mouth daily.    [provider]    Allergies: Patient has no known allergies.    Review of Systems  Psychiatric/Behavioral:  Positive for behavioral problems.   All other systems reviewed and are negative.   Updated Vital Signs BP 115/70 (BP Location: Left Arm)   Pulse 99   Temp 98.7 F (37.1 C) (Oral)   Resp 16   SpO2 100%   Physical Exam Vitals and nursing note reviewed.   Constitutional:      Appearance: She is well-developed.  HENT:     Head: Normocephalic and atraumatic.  Eyes:     Conjunctiva/sclera: Conjunctivae normal.     Pupils: Pupils are equal, round, and reactive to light.  Cardiovascular:     Rate and Rhythm: Normal rate and regular rhythm.     Heart sounds: Normal heart sounds.  Pulmonary:     Effort: Pulmonary effort is normal.     Breath sounds: Normal breath sounds.  Abdominal:     General: Bowel sounds are normal.     Palpations: Abdomen is soft.  Musculoskeletal:        General: Normal range of motion.     Cervical back: Normal range of motion.  Skin:    General: Skin is warm and dry.  Neurological:     Mental Status: She is alert and oriented to person, place, and time.     Comments: AAOx3, answering questions and following commands appropriately; equal strength UE and LE bilaterally; CN grossly intact; moves all extremities appropriately without ataxia; no focal neuro deficits or facial asymmetry appreciated  Psychiatric:     Comments: Denies SI/HI/AVH Making clear, concise, logical statements during discussions     (all labs ordered are listed, but only abnormal results are displayed) Labs Reviewed  COMPREHENSIVE METABOLIC PANEL WITH GFR - Abnormal;  Notable for the following components:      Result Value   Potassium 3.4 (*)    CO2 19 (*)    Creatinine, Ser 1.13 (*)    ALT 55 (*)    All other components within normal limits  ETHANOL  CBC  HCG, SERUM, QUALITATIVE  URINE DRUG SCREEN    EKG: None  Radiology: No results found.   Procedures   Medications Ordered in the ED - No data to display                                  Medical Decision Making Amount and/or Complexity of Data Reviewed Labs: ordered. Radiology: ordered and independent interpretation performed.   43 year old female presenting to the ED with family for evaluation for transient change in mental status.  Was recently started on  venlafaxine and desvenlafaxine by psychiatry, per her report it sounds like she was accidentally taking these incorrectly.  Was found to be a little paranoid by family today but has returned to baseline by time of my evaluation.  She is awake, alert, oriented here.  She is able to give me full recollection of medication changes, her psychiatrist, and even issues with her insurance as of recent.  She adamantly denies any SI/HI/AVH.  She is making clear, concise, and coherent statements here.  She does not appear altered currently and family confirms this is her baseline.  Labs were obtained here without any leukocytosis or significant electrolyte derangement.  Clinically, it seems like this is likely side effect from unintentional incorrect dosing of her medications.  Patient and family eager to go home which I think is reasonable as she has returned to baseline.  I recommended that she hold these medications until she can follow-up with her psychiatrist.  Patient and family are comfortable with this.  She was also given information for local behavioral health urgent care as needed.  Can return here for any new/acute changes.  Final diagnoses:  Transient alteration of awareness  Medication side effects    ED Discharge Orders     None          Jarold Olam HERO, PA-C 10/21/24 0210  "

## 2024-10-21 NOTE — Discharge Instructions (Signed)
 I would hold the new medications until you are able to follow-up with your psychiatrist. Local [facility] is available 24/7 as well. Return to the ED for any new/acute changes.
